# Patient Record
Sex: Male | Born: 1990 | Race: White | Hispanic: No | Marital: Married | Smoking: Light tobacco smoker
Health system: Southern US, Community
[De-identification: ages and names within clinical notes are randomized; demographics above are authoritative.]

---

## 2013-02-22 ENCOUNTER — Emergency Department: Payer: Self-pay | Admitting: Emergency Medicine

## 2013-02-22 LAB — COMPREHENSIVE METABOLIC PANEL
Albumin: 4.3 g/dL (ref 3.4–5.0)
Alkaline Phosphatase: 62 U/L (ref 50–136)
Anion Gap: 4 — ABNORMAL LOW (ref 7–16)
BUN: 11 mg/dL (ref 7–18)
Calcium, Total: 9.4 mg/dL (ref 8.5–10.1)
Chloride: 106 mmol/L (ref 98–107)
Creatinine: 0.82 mg/dL (ref 0.60–1.30)
EGFR (African American): >60
Potassium: 4 mmol/L (ref 3.5–5.1)
SGOT(AST): 29 U/L (ref 15–37)
SGPT (ALT): 28 U/L (ref 12–78)
Sodium: 140 mmol/L (ref 136–145)
Total Protein: 7.6 g/dL (ref 6.4–8.2)

## 2013-02-22 LAB — CBC
HCT: 45.2 % (ref 40.0–52.0)
MCH: 32.6 pg (ref 26.0–34.0)
MCHC: 35.1 g/dL (ref 32.0–36.0)
MCV: 93 fL (ref 80–100)
Platelet: 137 10*3/uL — ABNORMAL LOW (ref 150–440)
RDW: 12.6 % (ref 11.5–14.5)
WBC: 11.5 10*3/uL — ABNORMAL HIGH (ref 3.8–10.6)

## 2013-02-22 LAB — ETHANOL: Ethanol: <3 mg/dL

## 2014-03-27 ENCOUNTER — Emergency Department: Payer: Self-pay | Admitting: Emergency Medicine

## 2014-03-27 LAB — CBC
HCT: 55.6 % — ABNORMAL HIGH (ref 40.0–52.0)
HGB: 17.8 g/dL (ref 13.0–18.0)
MCH: 31.3 pg (ref 26.0–34.0)
MCHC: 32 g/dL (ref 32.0–36.0)
MCV: 98 fL (ref 80–100)
PLATELETS: 159 10*3/uL (ref 150–440)
RBC: 5.69 10*6/uL (ref 4.40–5.90)
RDW: 12.6 % (ref 11.5–14.5)
WBC: 9.6 10*3/uL (ref 3.8–10.6)

## 2014-03-27 LAB — COMPREHENSIVE METABOLIC PANEL
Albumin: 4.7 g/dL (ref 3.4–5.0)
Alkaline Phosphatase: 62 U/L
Anion Gap: 20 — ABNORMAL HIGH (ref 7–16)
BUN: 15 mg/dL (ref 7–18)
Bilirubin,Total: 0.7 mg/dL (ref 0.2–1.0)
Calcium, Total: 9.8 mg/dL (ref 8.5–10.1)
Chloride: 103 mmol/L (ref 98–107)
Co2: 19 mmol/L — ABNORMAL LOW (ref 21–32)
Creatinine: 1.17 mg/dL (ref 0.60–1.30)
EGFR (African American): >60
EGFR (Non-African Amer.): >60
Glucose: 107 mg/dL — ABNORMAL HIGH (ref 65–99)
Osmolality: 284 (ref 275–301)
Potassium: 4 mmol/L (ref 3.5–5.1)
SGOT(AST): 29 U/L (ref 15–37)
SGPT (ALT): 32 U/L
SODIUM: 142 mmol/L (ref 136–145)
TOTAL PROTEIN: 8.4 g/dL — AB (ref 6.4–8.2)

## 2014-03-27 LAB — URINALYSIS, COMPLETE
Bacteria: NONE SEEN
Bilirubin,UR: NEGATIVE
Blood: NEGATIVE
GLUCOSE, UR: NEGATIVE mg/dL (ref 0–75)
Ketone: NEGATIVE
Leukocyte Esterase: NEGATIVE
Nitrite: NEGATIVE
Ph: 7 (ref 4.5–8.0)
Protein: 30
Specific Gravity: 1.012 (ref 1.003–1.030)
Squamous Epithelial: NONE SEEN
WBC UR: 1 /HPF (ref 0–5)

## 2014-03-27 LAB — ETHANOL: Ethanol: <3 mg/dL

## 2014-03-27 LAB — DRUG SCREEN, URINE
Amphetamines, Ur Screen: NEGATIVE (ref ?–1000)
Barbiturates, Ur Screen: NEGATIVE (ref ?–200)
Benzodiazepine, Ur Scrn: POSITIVE (ref ?–200)
Cannabinoid 50 Ng, Ur ~~LOC~~: POSITIVE (ref ?–50)
Cocaine Metabolite,Ur ~~LOC~~: NEGATIVE (ref ?–300)
MDMA (ECSTASY) UR SCREEN: NEGATIVE (ref ?–500)
METHADONE, UR SCREEN: NEGATIVE (ref ?–300)
Opiate, Ur Screen: NEGATIVE (ref ?–300)
Phencyclidine (PCP) Ur S: NEGATIVE (ref ?–25)
Tricyclic, Ur Screen: NEGATIVE (ref ?–1000)

## 2014-03-27 LAB — TROPONIN I: Troponin-I: <0.02 ng/mL

## 2014-03-27 LAB — CK TOTAL AND CKMB (NOT AT ARMC)
CK, Total: 363 U/L — ABNORMAL HIGH
CK-MB: 3.1 ng/mL (ref 0.5–3.6)

## 2014-04-13 ENCOUNTER — Ambulatory Visit: Payer: Self-pay | Admitting: Neurology

## 2014-09-22 IMAGING — CR DG ELBOW 2V*L*
1 series · 1 of 1 positions shown · non-contrast
Comparison: none

REASON FOR EXAM: injury pain
COMMENTS:

PROCEDURE:     DXR - DXR ELBOW LEFT AP AND LATERAL  - February 22, 2013  [DATE]
RESULT:     There is posterior dislocation of the radius and ulna from the
left elbow with a small oval fragment projecting of the olecranon fossa. The
patient was obviously unable to straighten arm for the AP view.

[x elbow ap left]
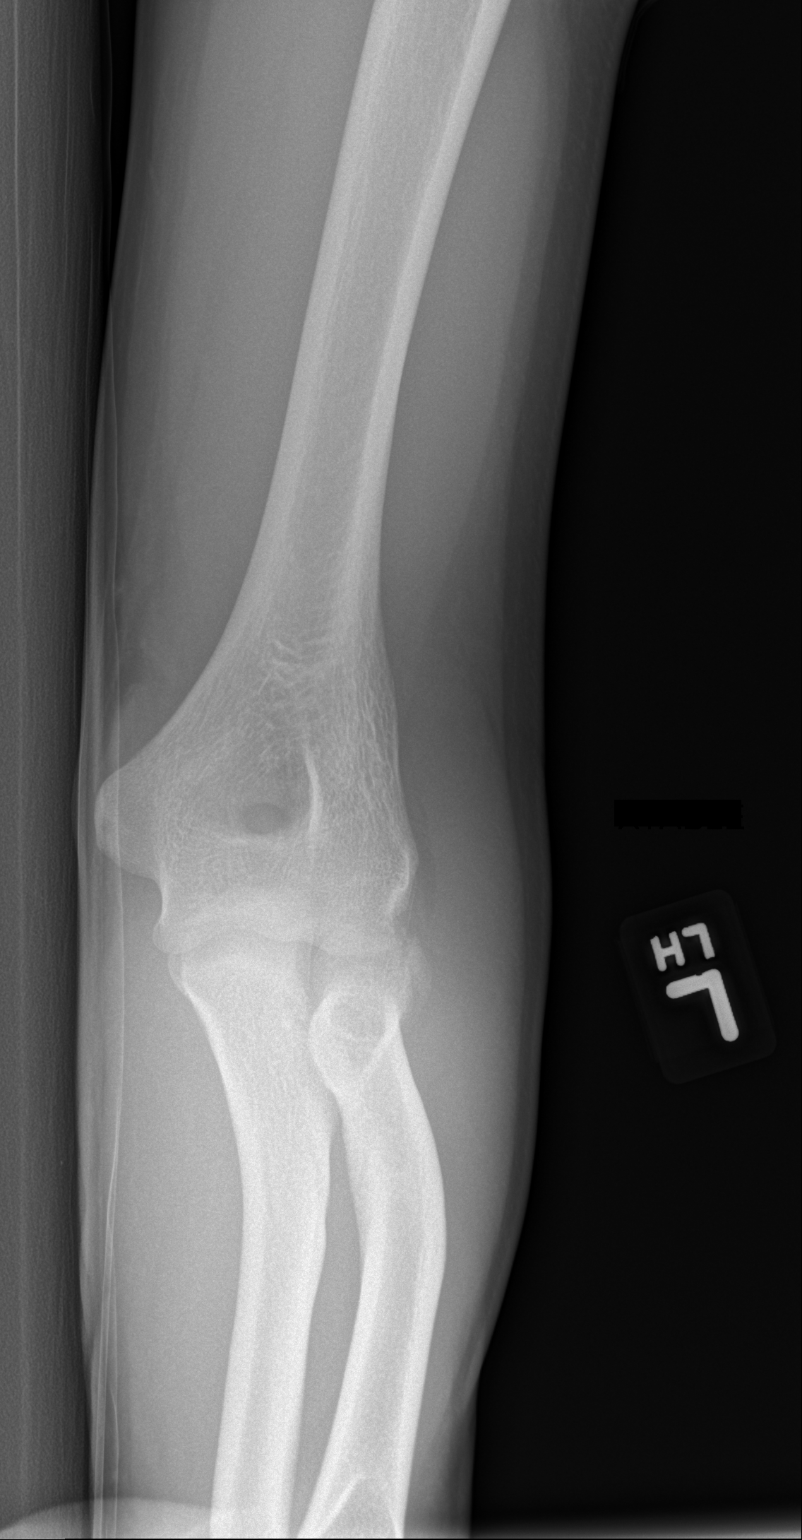

[1 of 1 positions shown; findings below may reference images not displayed]

IMPRESSION: Dislocation of the left elbow possibly with a small
fracture. Postreduction images are recommended.

[REDACTED]

## 2019-02-10 DIAGNOSIS — F419 Anxiety disorder, unspecified: Secondary | ICD-10-CM | POA: Insufficient documentation

## 2019-02-10 DIAGNOSIS — F1120 Opioid dependence, uncomplicated: Secondary | ICD-10-CM | POA: Insufficient documentation

## 2019-11-10 ENCOUNTER — Ambulatory Visit: Payer: 59

## 2019-11-10 ENCOUNTER — Other Ambulatory Visit: Payer: Self-pay

## 2019-11-10 DIAGNOSIS — Z0283 Encounter for blood-alcohol and blood-drug test: Secondary | ICD-10-CM

## 2019-11-10 NOTE — Progress Notes (Signed)
Presents for pre-employment drug screen. Specimen collected using [facility] Chain of Custody form for [facility] account number [id number]. Specimen ID [id number]  1:15 pm - 1 st attempt insufficient quantity 1:15 pm 8 oz bottle of water given 1:45 pm 8 oz bottle of water given 2:30 pm 2n attempt - sufficient quantity  AMD

## 2019-11-27 ENCOUNTER — Ambulatory Visit: Payer: Self-pay

## 2019-11-27 ENCOUNTER — Other Ambulatory Visit: Payer: Self-pay

## 2019-11-27 DIAGNOSIS — Z0110 Encounter for examination of ears and hearing without abnormal findings: Secondary | ICD-10-CM

## 2019-11-27 DIAGNOSIS — Z23 Encounter for immunization: Secondary | ICD-10-CM

## 2019-11-27 NOTE — Progress Notes (Signed)
Scheduled to review hearing screen results with Dr. Mayford Knife 12/01/19.  AMD

## 2019-12-01 ENCOUNTER — Ambulatory Visit: Payer: Self-pay | Admitting: Emergency Medicine

## 2019-12-01 ENCOUNTER — Other Ambulatory Visit: Payer: Self-pay

## 2019-12-01 ENCOUNTER — Encounter: Payer: Self-pay | Admitting: Emergency Medicine

## 2019-12-01 VITALS — BP 116/83 | HR 83 | Temp 97.4°F | Resp 16 | Ht 72.0 in | Wt 180.0 lb

## 2019-12-01 DIAGNOSIS — Z0000 Encounter for general adult medical examination without abnormal findings: Secondary | ICD-10-CM

## 2019-12-01 NOTE — Progress Notes (Signed)
  [facility] Occupational Health Provider Note       Time seen: 11:27 AM    I have reviewed the vital signs and the nursing notes.  HISTORY   Chief Complaint Employment Physical    HPI Thomas Bartlett is a 29 y.o. male with no known past medical history who presents today for employment physical.  He denies any complaints.  Recently had a hearing test done.  No past medical history on file.   Allergies Patient has no known allergies.  Review of Systems Constitutional: Negative for fever. Cardiovascular: Negative for chest pain. Respiratory: Negative for shortness of breath. Gastrointestinal: Negative for abdominal pain, vomiting and diarrhea. Musculoskeletal: Negative for back pain. Skin: Negative for rash. Neurological: Negative for headaches, focal weakness or numbness.  All systems negative/normal/unremarkable except as stated in the HPI  ____________________________________________   PHYSICAL EXAM:  VITAL SIGNS: Vitals:   12/01/19 1121  BP: 116/83  Pulse: 83  Resp: 16  Temp: (!) 97.4 F (36.3 C)  SpO2: 98%    Constitutional: Alert and oriented. Well appearing and in no distress. Eyes: Conjunctivae are normal. Normal extraocular movements. ENT      Head: Normocephalic and atraumatic.      Nose: No congestion/rhinnorhea.      Mouth/Throat: Mucous membranes are moist.      Neck: No stridor. Cardiovascular: Normal rate, regular rhythm. No murmurs, rubs, or gallops. Respiratory: Normal respiratory effort without tachypnea nor retractions. Breath sounds are clear and equal bilaterally. No wheezes/rales/rhonchi. Gastrointestinal: Soft and nontender. Normal bowel sounds Musculoskeletal: Nontender with normal range of motion in extremities. No lower extremity tenderness nor edema. Neurologic:  Normal speech and language. No gross focal neurologic deficits are appreciated.  Skin:  Skin is warm, dry and intact. No rash noted.  DIFFERENTIAL  DIAGNOSIS  Annual physical examination  ASSESSMENT AND PLAN  Annual physical   Plan: The patient had presented for annual physical without complaints.  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  Is encouraged to use hearing protection.  He has not noted any change in his hearing.  He is cleared for follow-up as needed.  Daryel November MD    Note: This note was generated in part or whole with voice recognition software. Voice recognition is usually quite accurate but there are transcription errors that can and very often do occur. I apologize for any typographical errors that were not detected and corrected.

## 2020-02-05 ENCOUNTER — Ambulatory Visit: Payer: Self-pay

## 2020-02-05 ENCOUNTER — Other Ambulatory Visit: Payer: Self-pay

## 2020-02-05 DIAGNOSIS — Z1152 Encounter for screening for COVID-19: Secondary | ICD-10-CM

## 2020-02-05 NOTE — Progress Notes (Signed)
Works at [employer] &  Jeannette How is his supervisor.  Contacted the clinic & said he was with a friend on Wednesday for 15 minutes or longer & within 6 feet.  Friend had covid test on Thursday.  Today while Thomas Bartlett was at work, friend called him & told him his covid test is positive.  Symptomatic - scratchy throat, sinus pressure & nasal drainage.  Thinks it's just his allergies.  No covd vaccines.  Presents to [facility] clinic for covid screen.  Assisted him with Mychart set-up.  Advised to quarantine till someone from [facility] covid hotline contacts him.  AMD & [facility]

## 2020-02-06 LAB — NOVEL CORONAVIRUS, NAA: SARS-CoV-2, NAA: DETECTED — AB

## 2020-02-06 LAB — SARS-COV-2, NAA 2 DAY TAT

## 2020-02-22 ENCOUNTER — Ambulatory Visit: Payer: Self-pay

## 2020-02-22 ENCOUNTER — Other Ambulatory Visit: Payer: Self-pay

## 2020-02-22 DIAGNOSIS — Z0000 Encounter for general adult medical examination without abnormal findings: Secondary | ICD-10-CM

## 2020-02-23 LAB — CMP12+LP+TP+TSH+6AC+CBC/D/PLT
ALT: 19 IU/L (ref 0–44)
AST: 26 IU/L (ref 0–40)
Albumin/Globulin Ratio: 1.9 (ref 1.2–2.2)
Albumin: 4.7 g/dL (ref 4.1–5.2)
Alkaline Phosphatase: 65 IU/L (ref 48–121)
BUN/Creatinine Ratio: 10 (ref 9–20)
BUN: 9 mg/dL (ref 6–20)
Basophils Absolute: 0 10*3/uL (ref 0.0–0.2)
Basos: 0 %
Bilirubin Total: 0.6 mg/dL (ref 0.0–1.2)
Calcium: 10.1 mg/dL (ref 8.7–10.2)
Chloride: 102 mmol/L (ref 96–106)
Chol/HDL Ratio: 2.2 ratio (ref 0.0–5.0)
Cholesterol, Total: 163 mg/dL (ref 100–199)
Creatinine, Ser: 0.92 mg/dL (ref 0.76–1.27)
EOS (ABSOLUTE): 0.1 10*3/uL (ref 0.0–0.4)
Eos: 1 %
Estimated CHD Risk: <0.5 times avg. (ref 0.0–1.0)
Free Thyroxine Index: 1.8 (ref 1.2–4.9)
GFR calc Af Amer: 129 mL/min/{1.73_m2} (ref >59)
GFR calc non Af Amer: 111 mL/min/{1.73_m2} (ref >59)
GGT: 13 IU/L (ref 0–65)
Globulin, Total: 2.5 g/dL (ref 1.5–4.5)
Glucose: 107 mg/dL — ABNORMAL HIGH (ref 65–99)
HDL: 73 mg/dL (ref >39)
Hematocrit: 44.4 % (ref 37.5–51.0)
Hemoglobin: 15.7 g/dL (ref 13.0–17.7)
Immature Grans (Abs): 0 10*3/uL (ref 0.0–0.1)
Immature Granulocytes: 0 %
Iron: 143 ug/dL (ref 38–169)
LDH: 176 IU/L (ref 121–224)
LDL Chol Calc (NIH): 72 mg/dL (ref 0–99)
Lymphocytes Absolute: 1.4 10*3/uL (ref 0.7–3.1)
Lymphs: 27 %
MCH: 32.4 pg (ref 26.6–33.0)
MCHC: 35.4 g/dL (ref 31.5–35.7)
MCV: 92 fL (ref 79–97)
Monocytes Absolute: 0.3 10*3/uL (ref 0.1–0.9)
Monocytes: 6 %
Neutrophils Absolute: 3.2 10*3/uL (ref 1.4–7.0)
Neutrophils: 66 %
Phosphorus: 1.9 mg/dL — ABNORMAL LOW (ref 2.8–4.1)
Platelets: 181 10*3/uL (ref 150–450)
Potassium: 4.5 mmol/L (ref 3.5–5.2)
RBC: 4.84 x10E6/uL (ref 4.14–5.80)
RDW: 12.4 % (ref 11.6–15.4)
Sodium: 140 mmol/L (ref 134–144)
T3 Uptake Ratio: 33 % (ref 24–39)
T4, Total: 5.6 ug/dL (ref 4.5–12.0)
TSH: 0.158 u[IU]/mL — ABNORMAL LOW (ref 0.450–4.500)
Total Protein: 7.2 g/dL (ref 6.0–8.5)
Triglycerides: 101 mg/dL (ref 0–149)
Uric Acid: 6.2 mg/dL (ref 3.8–8.4)
VLDL Cholesterol Cal: 18 mg/dL (ref 5–40)
WBC: 5 10*3/uL (ref 3.4–10.8)

## 2020-02-29 ENCOUNTER — Ambulatory Visit: Payer: Self-pay | Admitting: Emergency Medicine

## 2020-02-29 ENCOUNTER — Other Ambulatory Visit: Payer: Self-pay

## 2020-02-29 ENCOUNTER — Encounter: Payer: Self-pay | Admitting: Emergency Medicine

## 2020-02-29 VITALS — BP 127/91 | HR 85 | Temp 98.8°F | Resp 14 | Ht 72.0 in | Wt 183.0 lb

## 2020-02-29 DIAGNOSIS — Z0000 Encounter for general adult medical examination without abnormal findings: Secondary | ICD-10-CM

## 2020-02-29 NOTE — Progress Notes (Signed)
Occupational Health Provider Note       Time seen: 9:41 AM    I have reviewed the vital signs and the nursing notes.  HISTORY   Chief Complaint Annual Exam    HPI Thomas Bartlett is a 29 y.o. male with no significant past medical history who presents today for annual physical examination. Recently had covid 19 but denies complaints  History reviewed. No pertinent past medical history.  History reviewed. No pertinent surgical history.  Allergies Patient has no known allergies.  Review of Systems Constitutional: Negative for fever. Cardiovascular: Negative for chest pain. Respiratory: Negative for shortness of breath. Gastrointestinal: Negative for abdominal pain, vomiting and diarrhea. Musculoskeletal: Negative for back pain. Skin: Negative for rash. Neurological: Negative for headaches, focal weakness or numbness.  All systems negative/normal/unremarkable except as stated in the HPI  ____________________________________________   PHYSICAL EXAM:  VITAL SIGNS: Vitals:   02/29/20 0932  BP: (!) 127/91  Pulse: 85  Resp: 14  Temp: 98.8 F (37.1 C)  SpO2: 100%    Constitutional: Alert and oriented. Well appearing and in no distress. Eyes: Conjunctivae are normal. Normal extraocular movements. ENT      Head: Normocephalic and atraumatic.      Nose: No congestion/rhinnorhea.      Mouth/Throat: Mucous membranes are moist.      Neck: No stridor. Cardiovascular: Normal rate, regular rhythm. No murmurs, rubs, or gallops. Respiratory: Normal respiratory effort without tachypnea nor retractions. Breath sounds are clear and equal bilaterally. No wheezes/rales/rhonchi. Gastrointestinal: Soft and nontender. Normal bowel sounds Musculoskeletal: Nontender with normal range of motion in extremities. No lower extremity tenderness nor edema. Neurologic:  Normal speech and language. No gross focal neurologic deficits are appreciated.  Skin:  Skin is warm, dry and  intact. No rash noted. Psychiatric: Speech and behavior are normal.  ____________________________________________   LABS (pertinent positives/negatives)  Recent Results (from the past 2160 hour(s))  Novel Coronavirus, NAA ([facility])     Status: Abnormal   Collection Time: 02/05/20 12:00 AM   Specimen: Nasopharyngeal(NP) swabs in vial transport medium   Nasopharynge  Is this  Result Value Ref Range   SARS-CoV-2, NAA Detected (A) Not Detected    Comment: Patients who have a positive COVID-19 test result may now have treatment options. Treatment options are available for patients with mild to moderate symptoms and for hospitalized patients. Visit our website at [email or web address] for resources and information. This nucleic acid amplification test was developed and its performance characteristics determined by [employer]. Nucleic acid amplification tests include RT-PCR and TMA. This test has not been FDA cleared or approved. This test has been authorized by FDA under an Emergency Use Authorization (EUA). This test is only authorized for the duration of time the declaration that circumstances exist justifying the authorization of the emergency use of in vitro diagnostic tests for detection of SARS-CoV-2 virus and/or diagnosis of COVID-19 infection under section 564(b)(1) of the Act, 21 U.S.C. 219XJO-8(T) (1), unless the authorization is terminated or revoked sooner. When diagnostic testing is negativ e, the possibility of a false negative result should be considered in the context of a patient's recent exposures and the presence of clinical signs and symptoms consistent with COVID-19. An individual without symptoms of COVID-19 and who is not shedding SARS-CoV-2 virus would expect to have a negative (not detected) result in this assay.   SARS-COV-2, NAA 2 DAY TAT     Status: None   Collection Time: 02/05/20 12:00 AM   Nasopharynge  Is this  Result Value Ref  Range   SARS-CoV-2, NAA 2 DAY TAT Performed   CMP12+LP+TP+TSH+6AC+CBC/D/Plt     Status: Abnormal   Collection Time: 02/22/20  8:17 AM  Result Value Ref Range   Glucose 107 (H) 65 - 99 mg/dL   Uric Acid 6.2 3.8 - 8.4 mg/dL    Comment:            Therapeutic target for gout patients: <6.0   BUN 9 6 - 20 mg/dL   Creatinine, Ser 0.92 0.76 - 1.27 mg/dL   GFR calc non Af Amer 111 >59 mL/min/1.73   GFR calc Af Amer 129 >59 mL/min/1.73    Comment: **[facility] currently reports eGFR in compliance with the current**   recommendations of the [employer]. [facility] will   update reporting as new guidelines are published from the NKF-ASN   Task force.    BUN/Creatinine Ratio 10 9 - 20   Sodium 140 134 - 144 mmol/L   Potassium 4.5 3.5 - 5.2 mmol/L   Chloride 102 96 - 106 mmol/L   Calcium 10.1 8.7 - 10.2 mg/dL   Phosphorus 1.9 (L) 2.8 - 4.1 mg/dL   Total Protein 7.2 6.0 - 8.5 g/dL   Albumin 4.7 4.1 - 5.2 g/dL   Globulin, Total 2.5 1.5 - 4.5 g/dL   Albumin/Globulin Ratio 1.9 1.2 - 2.2   Bilirubin Total 0.6 0.0 - 1.2 mg/dL   Alkaline Phosphatase 65 48 - 121 IU/L   LDH 176 121 - 224 IU/L   AST 26 0 - 40 IU/L   ALT 19 0 - 44 IU/L   GGT 13 0 - 65 IU/L   Iron 143 38 - 169 ug/dL   Cholesterol, Total 163 100 - 199 mg/dL   Triglycerides 101 0 - 149 mg/dL   HDL 73 >39 mg/dL   VLDL Cholesterol Cal 18 5 - 40 mg/dL   LDL Chol Calc (NIH) 72 0 - 99 mg/dL   Chol/HDL Ratio 2.2 0.0 - 5.0 ratio    Comment:                                   T. Chol/HDL Ratio                                             Men  Women                               1/2 Avg.Risk  3.4    3.3                                   Avg.Risk  5.0    4.4                                2X Avg.Risk  9.6    7.1                                3X Avg.Risk 23.4   11.0    Estimated CHD Risk  < 0.5 0.0 -  1.0 times avg.    Comment: The CHD Risk is based on the T. Chol/HDL ratio. Other factors affect CHD Risk such as hypertension,  smoking, diabetes, severe obesity, and family history of premature CHD.    TSH 0.158 (L) 0.450 - 4.500 uIU/mL   T4, Total 5.6 4.5 - 12.0 ug/dL   T3 Uptake Ratio 33 24 - 39 %   Free Thyroxine Index 1.8 1.2 - 4.9   WBC 5.0 3.4 - 10.8 x10E3/uL   RBC 4.84 4.14 - 5.80 x10E6/uL   Hemoglobin 15.7 13.0 - 17.7 g/dL   Hematocrit 44.4 37.5 - 51.0 %   MCV 92 79 - 97 fL   MCH 32.4 26.6 - 33.0 pg   MCHC 35.4 31 - 35 g/dL   RDW 12.4 11.6 - 15.4 %   Platelets 181 150 - 450 x10E3/uL   Neutrophils 66 Not Estab. %   Lymphs 27 Not Estab. %   Monocytes 6 Not Estab. %   Eos 1 Not Estab. %   Basos 0 Not Estab. %   Neutrophils Absolute 3.2 1 - 7 x10E3/uL   Lymphocytes Absolute 1.4 0 - 3 x10E3/uL   Monocytes Absolute 0.3 0 - 0 x10E3/uL   EOS (ABSOLUTE) 0.1 0.0 - 0.4 x10E3/uL   Basophils Absolute 0.0 0 - 0 x10E3/uL   Immature Granulocytes 0 Not Estab. %   Immature Grans (Abs) 0.0 0.0 - 0.1 x10E3/uL     ASSESSMENT AND PLAN  Annual Physical Examination   Plan: The patient had presented for his annual physical. Patient's labs were overall reassuring. No complaints at this time. He is cleared for follow up as needed.   Lenise Arena MD    Note: This note was generated in part or whole with voice recognition software. Voice recognition is usually quite accurate but there are transcription errors that can and very often do occur. I apologize for any typographical errors that were not detected and corrected.

## 2020-07-15 ENCOUNTER — Ambulatory Visit: Admit: 2020-07-15 | Disposition: A | Discharge status: Home / Self Care | Payer: Self-pay

## 2023-05-31 ENCOUNTER — Ambulatory Visit: Payer: Self-pay | Admitting: Podiatry

## 2023-06-03 ENCOUNTER — Ambulatory Visit: Payer: Managed Care, Other (non HMO) | Admitting: Podiatry

## 2023-07-19 ENCOUNTER — Ambulatory Visit: Payer: Managed Care, Other (non HMO) | Admitting: Podiatry

## 2024-02-28 ENCOUNTER — Ambulatory Visit: Payer: Self-pay | Admitting: Family Medicine
